# Patient Record
Sex: Female | Born: 1995 | Race: White | Hispanic: No | Marital: Single | Smoking: Never smoker
Health system: Southern US, Community
[De-identification: ages and names within clinical notes are randomized; demographics above are authoritative.]

---
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
## 2017-06-23 DIAGNOSIS — A084 Viral intestinal infection, unspecified: Secondary | ICD-10-CM | POA: Diagnosis not present

## 2017-07-15 DIAGNOSIS — R197 Diarrhea, unspecified: Secondary | ICD-10-CM | POA: Diagnosis not present

## 2017-07-15 DIAGNOSIS — R1084 Generalized abdominal pain: Secondary | ICD-10-CM | POA: Diagnosis not present

## 2017-07-15 DIAGNOSIS — R112 Nausea with vomiting, unspecified: Secondary | ICD-10-CM | POA: Diagnosis not present

## 2018-03-09 IMAGING — DX DG KNEE 1-2V*R*
2 series · 2 of 2 positions shown · non-contrast
Comparison: None.

CLINICAL DATA: Injured knee.  Dislocated patella.

EXAM:
RIGHT KNEE - 1-2 VIEW

[knee ap]
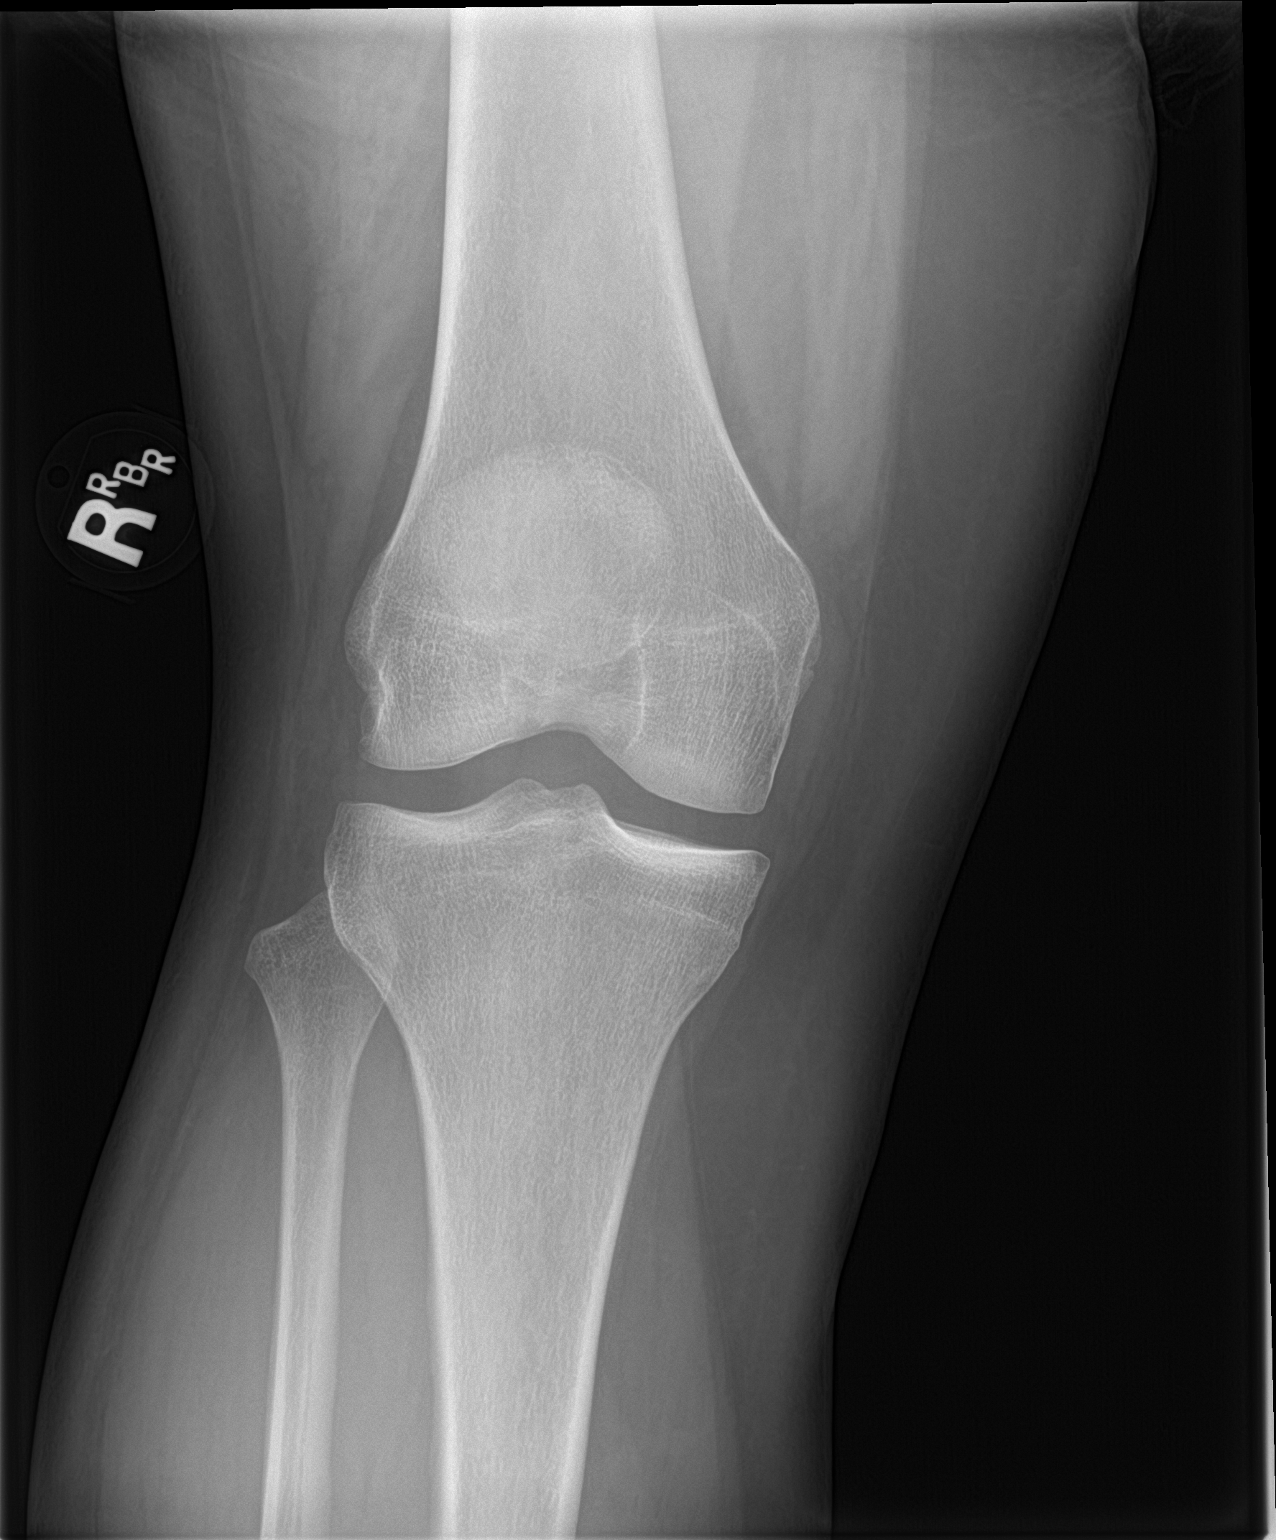

[knee lat]
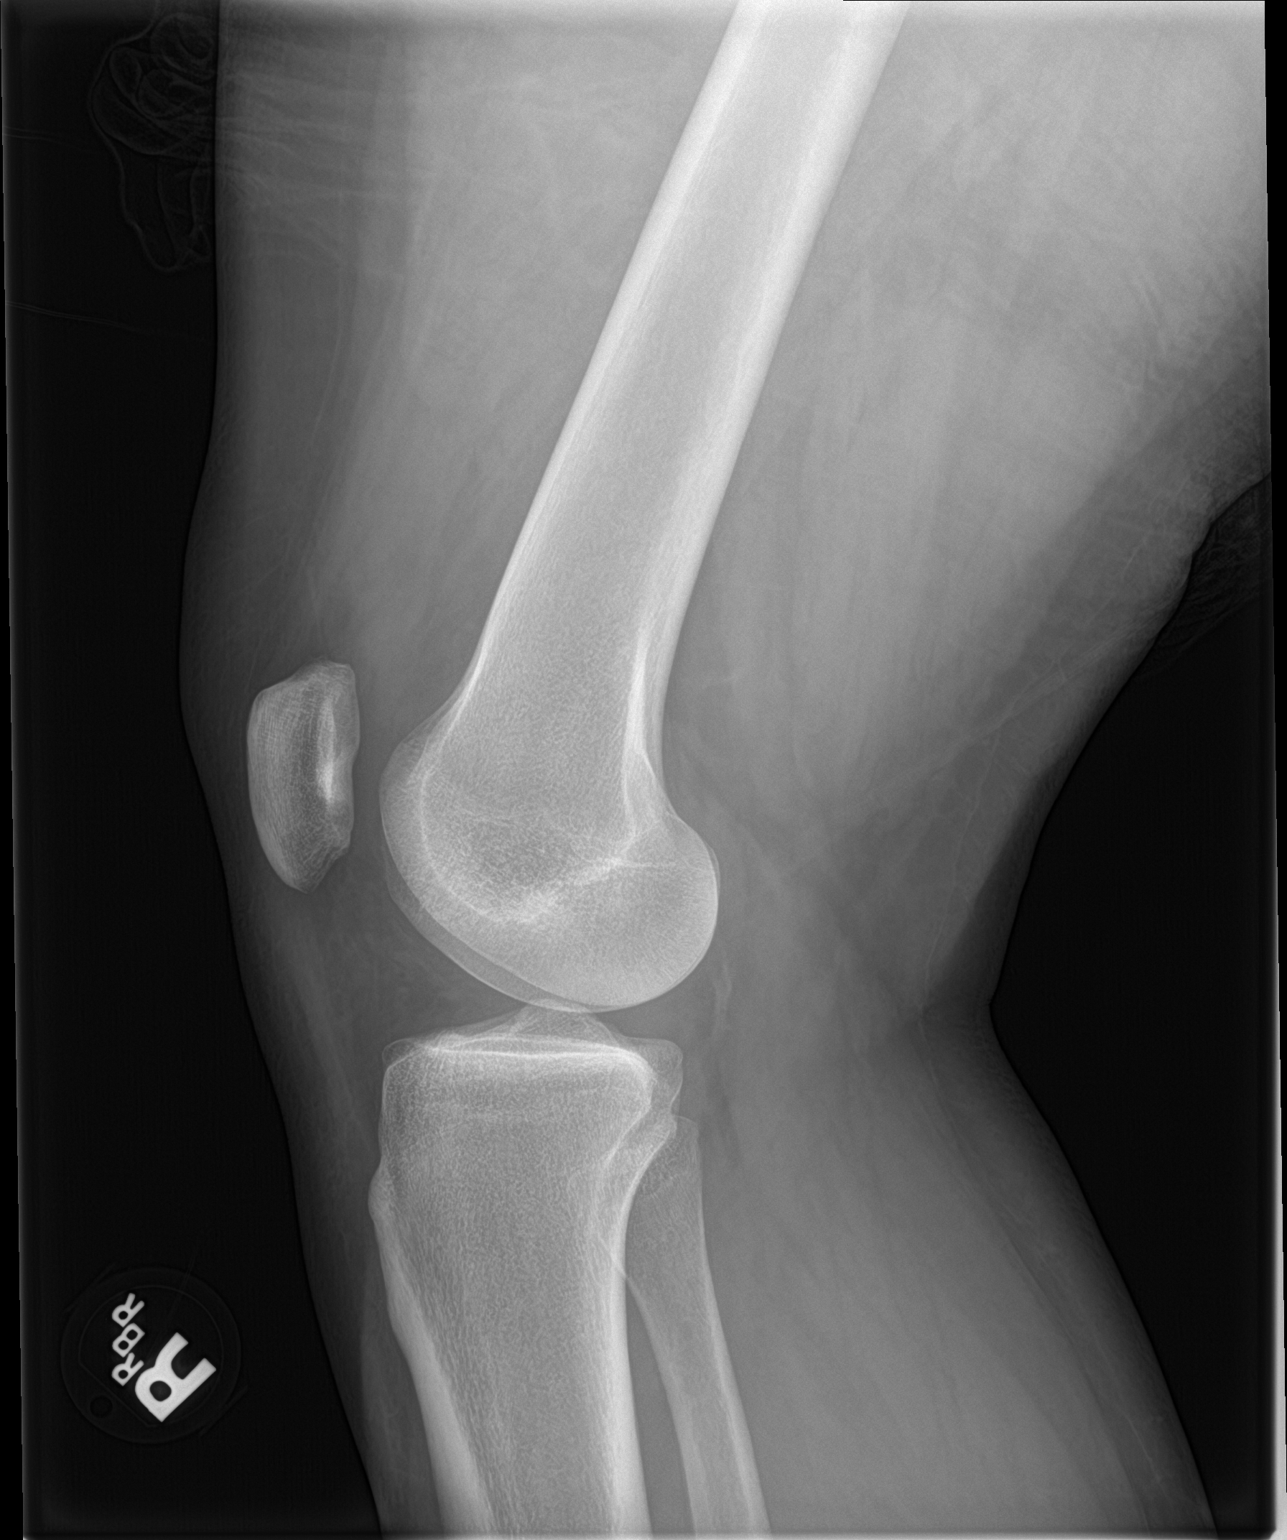

[2 of 2 positions shown; findings below may reference images not displayed]

FINDINGS: The joint spaces are normal. No acute bony findings, degenerative
changes or osteochondral abnormality. There is a moderate-sized
joint effusion.
IMPRESSION: No acute bony findings.

Moderate-sized joint effusion.

## 2020-11-27 ENCOUNTER — Ambulatory Visit (INDEPENDENT_AMBULATORY_CARE_PROVIDER_SITE_OTHER): Payer: Commercial Managed Care - PPO | Admitting: Sports Medicine

## 2020-11-27 ENCOUNTER — Other Ambulatory Visit: Payer: Self-pay

## 2020-11-27 ENCOUNTER — Encounter: Payer: Self-pay | Admitting: Sports Medicine

## 2020-11-27 VITALS — Ht 65.5 in | Wt 250.0 lb

## 2020-11-27 DIAGNOSIS — M7041 Prepatellar bursitis, right knee: Secondary | ICD-10-CM | POA: Diagnosis not present

## 2020-11-27 MED ORDER — CEPHALEXIN 500 MG PO CAPS: 500.0000 mg | ORAL_CAPSULE | Freq: Four times a day (QID) | ORAL | 0 refills | Status: AC

## 2020-11-27 MED ORDER — IBUPROFEN 800 MG PO TABS: 800.0000 mg | ORAL_TABLET | Freq: Three times a day (TID) | ORAL | 0 refills | Status: AC | PRN

## 2020-11-27 NOTE — Patient Instructions (Signed)
You have an infection around your patella (the kneecap). For this we have prescribed an antibiotic called Keflex that you will need to take 4 times daily for the next 10 days. We have also prescribed ibuprofen for you to take every 8 hours as needed for pain relief. You can continue alternating ice and heat for pain relief and we will provide you with crutches as well. We will plan to see you in follow up in one week. If your pain or swelling significantly worsens in the interim please give [place] a call and we will get you to come in sooner.

## 2020-11-27 NOTE — Progress Notes (Signed)
   Subjective:    Patient ID: Jennifer Church, female    DOB: 05-09-1995, 25 y.o.   MRN: [id number]  Jennifer Church is a 25 year old woman presenting today for right knee pain.  She reports onset of pain approximately 3 days ago.  Denies inciting event or injury at that time.  She currently endorses pain along the anterior portion of her knee above the patella, inferior and medial to the patella as well.  She reports a clicking sensation when bending her right knee that has been present since she dislocated her patella in 2018. The clicking is now painful, whereas previously it was not. Jennifer Church is also having difficulty bearing weight on the right knee due to pain.  Her pain is exacerbated with walking in general and also with going up and down stairs.  She denies pain in her right ankle or hip and numbness/tingling in her right leg.  Additionally denies subjective fever/chills or rash.  She has been taking ibuprofen and using a heating pad for pain relief with minimal symptomatic improvement.  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  She has not been on her feet more than usual recently, started a new exercise program, or had any recent changes to her medications.  ROS: Negative unless otherwise noted above    Objective:  Right knee Inspection: Erythema present over the patella Palpation: There is increased warmth of the right knee compared to the left. TTP noted over the patella, inferior to patella at the site of patellar tendon insertion, and medial to the patella. ROM: ROM generally intact.  There is audible clicking with flexion of the knee. Special Testing: Positive patellar grind and patellar apprehension test.  Negative Lachman's, anterior drawer, posterior drawer, McMurray's.  No pain elicited with valgus/varus stress.  Left Knee: No erythema appreciated on inspection No warmth or TTP Normal ROM  R Knee Ultrasound (11/27/20) There is a small fluid collection  present superior to the patella No right knee effusion on ultrasound    Assessment & Plan:   Prepatellar septic bursitis Exam and ultrasound findings are consistent with prepatellar bursitis.  We will plan to treat this with Keflex 500 mg 4 times daily x10 days and ibuprofen 800 mg 3 times daily as needed.  She can continue additional supportive measures such as alternating heat/ice.  We will also give her crutches to assist with ambulation until she is able to walk without pain.  Plan for follow-up in 1 week to ensure resolution of bursitis.  She has been instructed to contact our office if her pain worsens or she develops systemic symptoms such as fever/chills.  If the bursitis persists or has increased in size at follow-up, consider aspiration or extension of antibiotic course.   Christel Mormon, MD PGY-3  Patient seen and evaluated with the resident.  I agree with the above plan of care.  Patient has slight swelling and erythema in the anterior knee.  Ultrasound shows a small fluid collection in the prepatellar bursa.  I do not see an effusion on ultrasound.  Diagnosis is consistent with probable early septic prepatellar bursitis.  Treatment with Keflex as above and follow-up in 1 week.  Red flag symptoms and signs were discussed and patient will seek treatment immediately at a local urgent care if these develop.

## 2020-12-04 ENCOUNTER — Ambulatory Visit (INDEPENDENT_AMBULATORY_CARE_PROVIDER_SITE_OTHER): Payer: Commercial Managed Care - PPO | Admitting: Sports Medicine

## 2020-12-04 ENCOUNTER — Other Ambulatory Visit: Payer: Self-pay

## 2020-12-04 VITALS — Ht 65.0 in | Wt 250.0 lb

## 2020-12-04 DIAGNOSIS — M7041 Prepatellar bursitis, right knee: Secondary | ICD-10-CM

## 2020-12-04 NOTE — Progress Notes (Signed)
PCP: Pcp, No  Subjective:   HPI: Patient is a 25 y.o. female here for follow-up of right prepatellar septic bursitis.  Luvia was seen on 11/27/20 and started on Keflex 500 mg 4 times daily x 10 days and ibuprofen 800 mg 3 times daily as needed.  She states she was able to pick the antibiotic up the following day on 11/28/20.  She has been taking it 4 times daily as prescribed, denies any adverse effects. She states that on day 6 of 10 of her antibiotic therapy, her knee pain started to resolve and her redness resolved.  She has been off of crutches and ambulating on her own for the last 2 days without pain.  She reports that the redness has completely subsided.  She took some ibuprofen the first few days, however has not needed this over the last couple days.  She denies any knee pain, redness, swelling.  Denies any fever or chills herself.  She reports full motion of the knee without pain or discomfort.   Ht 5\' 5"  (1.651 m)   Wt 250 lb (113.4 kg)   BMI 41.60 kg/m   [facility] 11/27/2020 12/04/2020  Average time in minutes 25 25  Frequency of strengthening activities (# of days/week) 1 1    No flowsheet data found.      Objective:  Physical Exam:  Gen: Well-appearing, in no acute distress; non-toxic CV: Regular Rate. Well-perfused. Warm.  Resp: Breathing unlabored on room air; no wheezing. Psych: Fluid speech in conversation; appropriate affect; normal thought process Neuro: Sensation intact throughout. No gross coordination deficits.  MSK:  - Right knee: There is no erythema, ecchymosis, or effusion noted.  Very slight increase swelling in the prepatellar area of the right knee compared to the left, however no warmth noted to this area.  There is no tenderness to palpation throughout the knee.  Full range of motion in flexion and extension.  No varus or valgus instability at 0 and 30 degrees.  Strength is 5/5 with knee flexion and extension without pain or  difficulty.  Patient is neurovascularly intact distally.  Sensation to light touch intact throughout the extremity.  Negative patellar grind, Clark's test.     Assessment & Plan:  1. Prepatellar bursitis, right knee - improving.  Patient is on day 6 of 10 of antibiotic therapy.  Pain, redness, swelling all improved over the last few days, ambulating now without difficulty.  No signs of infection currently.  - Patient is to complete previously prescribed 10-day course of Keflex, she is aware to take the entire antibiotic course - No restrictions at this point, may take ibuprofen or over-the-counter Tylenol as needed if any pain is still present -Would expect this to heal fully with completion of antibiotic course, did discuss return precautions if any redness, pain or swelling recurs in the knee -Follow-up as needed  12/06/2020, DO PGY-4, Sports Medicine Fellow [facility]

## 2023-12-06 ENCOUNTER — Emergency Department (HOSPITAL_BASED_OUTPATIENT_CLINIC_OR_DEPARTMENT_OTHER)
Admission: EM | Admit: 2023-12-06 | Discharge: 2023-12-06 | Disposition: A | Discharge status: Home / Self Care | Attending: Emergency Medicine | Admitting: Emergency Medicine

## 2023-12-06 ENCOUNTER — Encounter (HOSPITAL_BASED_OUTPATIENT_CLINIC_OR_DEPARTMENT_OTHER): Payer: Self-pay | Admitting: Emergency Medicine

## 2023-12-06 ENCOUNTER — Other Ambulatory Visit: Payer: Self-pay

## 2023-12-06 DIAGNOSIS — R202 Paresthesia of skin: Secondary | ICD-10-CM | POA: Diagnosis present

## 2023-12-06 LAB — BASIC METABOLIC PANEL WITH GFR
Anion gap: 11 (ref 5–15)
BUN: 17 mg/dL (ref 6–20)
CO2: 22 mmol/L (ref 22–32)
Calcium: 9.3 mg/dL (ref 8.9–10.3)
Chloride: 104 mmol/L (ref 98–111)
Creatinine, Ser: 0.98 mg/dL (ref 0.44–1.00)
GFR, Estimated: >60 mL/min (ref >60)
Glucose, Bld: 105 mg/dL — ABNORMAL HIGH (ref 70–99)
Potassium: 4.2 mmol/L (ref 3.5–5.1)
Sodium: 138 mmol/L (ref 135–145)

## 2023-12-06 LAB — CBC
HCT: 45 % (ref 36.0–46.0)
Hemoglobin: 15 g/dL (ref 12.0–15.0)
MCH: 29.1 pg (ref 26.0–34.0)
MCHC: 33.3 g/dL (ref 30.0–36.0)
MCV: 87.4 fL (ref 80.0–100.0)
Platelets: 303 K/uL (ref 150–400)
RBC: 5.15 MIL/uL — ABNORMAL HIGH (ref 3.87–5.11)
RDW: 12.2 % (ref 11.5–15.5)
WBC: 9.5 K/uL (ref 4.0–10.5)
nRBC: 0 % (ref 0.0–0.2)

## 2023-12-06 LAB — FOLATE: Folate: 8.7 ng/mL (ref >5.9)

## 2023-12-06 LAB — HCG, QUANTITATIVE, PREGNANCY: hCG, Beta Chain, Quant, S: <1 m[IU]/mL (ref <5)

## 2023-12-06 LAB — VITAMIN B12: Vitamin B-12: 272 pg/mL (ref 180–914)

## 2023-12-06 LAB — HEMOGLOBIN A1C
Hgb A1c MFr Bld: 5 % (ref 4.8–5.6)
Mean Plasma Glucose: 96.8 mg/dL

## 2023-12-06 LAB — TSH: TSH: 1.36 u[IU]/mL (ref 0.350–4.500)

## 2023-12-06 NOTE — ED Notes (Signed)
 ED Provider at bedside Corlis PA).

## 2023-12-06 NOTE — Discharge Instructions (Addendum)
 As discussed, your basic labs today were normal.  I sent an additional vitamin testing, thyroid testing, folate testing A1c to see if we can find any abnormality metabolically that is causing this.  Follow MyChart for results of testing.  Will send a referral for neurology for reevaluation.

## 2023-12-06 NOTE — ED Triage Notes (Signed)
 Pt c/o intermittent tingly and burning sensation to bil forearms and BLE since Wed

## 2023-12-06 NOTE — ED Provider Notes (Signed)
 [place] [facility] AT [facility] Provider Note   CSN: [id number] Arrival date & time: 12/06/23  1633     Patient presents with: Tingling   Jennifer Church is a 28 y.o. female.   HPI   28 year old female presents emergency department with complaints of tingling/burning sensation to her forearms as well as her calfs.  States that she noticed this on Wednesday.  Symptoms been somewhat intermittent since onset but over the past day to 2 days, has been more constant.  Denies any weakness in her arms and legs.  Denies any history of similar symptoms in the past.  Denies any family history of neurologic conditions or similar issues.  Patient denies any restricted diet, change in diet.  Denies any family history of.  Denies any neck or back pain.  Denies any fevers, chills.  No significant pertinent past medical history.  Prior to Admission medications   Medication Sig Start Date End Date Taking? Authorizing Provider  HYDROcodone -acetaminophen  (NORCO) 5-325 MG tablet Take 1 tablet by mouth every 6 (six) hours as needed for moderate pain. 05/02/16   Hudnall, Ludie SAUNDERS, MD  ibuprofen  (ADVIL ) 800 MG tablet Take 1 tablet (800 mg total) by mouth every 8 (eight) hours as needed. 11/27/20   Draper, Timothy R, DO  mupirocin  cream (BACTROBAN ) 2 % Apply 1 application topically 2 (two) times daily. 11/05/14   Lynnea Charleston, MD  sulfamethoxazole -trimethoprim  (BACTRIM  DS,SEPTRA  DS) 800-160 MG per tablet Take 1 tablet by mouth 2 (two) times daily. 11/05/14   Lynnea Charleston, MD    Allergies: Patient has no known allergies.    Review of Systems  All other systems reviewed and are negative.   Updated Vital Signs BP (!) 166/110 (BP Location: Right Arm)   Pulse 96   Temp 98.4 F (36.9 C) (Oral)   Resp 18   Ht 5' 5 (1.651 m)   Wt (!) 145.2 kg   SpO2 100%   BMI 53.25 kg/m   Physical Exam Vitals and nursing note reviewed.  Constitutional:      General: She is not in acute  distress.    Appearance: She is well-developed.  HENT:     Head: Normocephalic and atraumatic.  Eyes:     Conjunctiva/sclera: Conjunctivae normal.  Cardiovascular:     Rate and Rhythm: Normal rate and regular rhythm.     Pulses: Normal pulses.     Heart sounds: No murmur heard. Pulmonary:     Effort: Pulmonary effort is normal. No respiratory distress.     Breath sounds: Normal breath sounds.  Abdominal:     Palpations: Abdomen is soft.     Tenderness: There is no abdominal tenderness.  Musculoskeletal:        General: No swelling.     Cervical back: Neck supple.  Skin:    General: Skin is warm and dry.     Capillary Refill: Capillary refill takes less than 2 seconds.  Neurological:     Mental Status: She is alert.     Comments: Alert and oriented to self, place, time and event.   Speech is fluent, clear without dysarthria or dysphasia.   Strength 5/5 in upper/lower extremities   Sensation intact in upper/lower extremities.  Patient reporting tingling/burning sensation to the dorsal aspect of forearm as well as bilateral calf.  Symptoms not worsened with palpation Reflexes 2+ at patella as well as Achilles.  Normal gait.  Not tested CN I not tested  CN II not  CN  III, IV, VI PERRLA and EOMs intact bilaterally  CN V Intact sensation to sharp and light touch to the face  CN VII facial movements symmetric  CN VIII not tested  CN IX, X no uvula deviation, symmetric rise of soft palate  CN XI 5/5 SCM and trapezius strength bilaterally  CN XII Midline tongue protrusion, symmetric L/R movements     Psychiatric:        Mood and Affect: Mood normal.     (all labs ordered are listed, but only abnormal results are displayed) Labs Reviewed - No data to display  EKG: None  Radiology: No results found.   Procedures   Medications Ordered in the ED - No data to display                                  Medical Decision Making  This patient presents to the ED for  concern of tingling, this involves an extensive number of treatment options, and is a complaint that carries with it a high risk of complications and morbidity.  The differential diagnosis includes electrolyte derangement, thyroid abnormality, vitamin deficiency, MS, cord compression, peripheral neuropathy, ischemic limb, other   Co morbidities that complicate the patient evaluation  See HPI   Additional history obtained:  Additional history obtained from EMR External records from outside source obtained and reviewed including hospital records   Lab Tests:  I Ordered, and personally interpreted labs.  The pertinent results include: No leukocytosis.  No evidence of anemia.  Platelets within normal range.  No electrolyte abnormalities.  No renal dysfunction.  TSH, folate, B12, A1c pending.   Imaging Studies ordered:  N/a   Cardiac Monitoring: / EKG:  N/a   Consultations Obtained:  N/a   Problem List / ED Course / Critical interventions / Medication management  Tingling arms and legs Reevaluation of the patient showed that the patient stayed the same I have reviewed the patients home medicines and have made adjustments as needed   Social Determinants of Health:  Denies tobacco, licit drug use.   Test / Admission - Considered:  Tingling arms and legs Vitals signs significant for hypertension blood pressure 166/110. Otherwise within normal range and stable throughout visit. Laboratory studies significant for: See above  28 year old female presents emergency department with complaints of tingling/burning sensation to her forearms as well as her calfs.  States that she noticed this on Wednesday.  Symptoms been somewhat intermittent since onset but over the past day to 2 days, has been more constant.  Denies any weakness in her arms and legs.  Denies any history of similar symptoms in the past.  Denies any family history of neurologic conditions or similar issues.  Patient  denies any restricted diet, change in diet.  Denies any family history of.  Denies any neck or back pain.  Denies any fevers, chills. On exam, patient reporting tingling/burning sensation on the dorsal aspect of forearms as well as calf region.  No obvious weakness on exam.  Otherwise, neurologic exam benign.  Basic labs negative.  Pending TSH, B12, folate, A1c.  Considered further workup via MRI imaging but in the absence of concerning neurologic features, will defer to the outpatient setting.  Unsure of exact etiology of patient's stocking-glove type paresthesia symptoms do not improve.  Recommend follow-up with neurology outpatient setting.  Treatment plan discussed with patient she acknowledged understanding and was agreeable.  Patient well-appearing, febrile no  acute distress. Worrisome signs and symptoms were discussed with the patient, and the patient acknowledged understanding to return to the ED if noticed. Patient was stable upon discharge.       Final diagnoses:  None    ED Discharge Orders     None          Silver Wonda LABOR, [place] 12/06/23 1811    Mannie Pac T, DO 12/06/23 2316

## 2023-12-07 ENCOUNTER — Encounter: Payer: Self-pay | Admitting: Neurology

## 2024-02-02 ENCOUNTER — Ambulatory Visit: Admitting: Neurology
